# Patient Record
Sex: Male | Born: 1980 | Race: Black or African American | Hispanic: No | Marital: Single | Smoking: Current every day smoker
Health system: Southern US, Community
[De-identification: ages and names within clinical notes are randomized; demographics above are authoritative.]

## PROBLEM LIST (undated history)

## (undated) HISTORY — PX: APPENDECTOMY: SHX54

---

## 2004-07-30 ENCOUNTER — Emergency Department: Payer: Self-pay | Admitting: Emergency Medicine

## 2004-07-30 ENCOUNTER — Other Ambulatory Visit: Payer: Self-pay

## 2006-03-27 ENCOUNTER — Emergency Department: Payer: Self-pay
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
## 2010-09-12 ENCOUNTER — Emergency Department: Payer: Self-pay | Admitting: Emergency Medicine

## 2012-03-04 ENCOUNTER — Emergency Department: Payer: Self-pay | Admitting: Emergency Medicine

## 2012-03-04 LAB — URINALYSIS, COMPLETE
Bilirubin,UR: NEGATIVE
Glucose,UR: NEGATIVE mg/dL (ref 0–75)
Nitrite: NEGATIVE
Ph: 5 (ref 4.5–8.0)
RBC,UR: 1 /HPF (ref 0–5)
Specific Gravity: 1.028 (ref 1.003–1.030)

## 2012-10-14 DIAGNOSIS — E785 Hyperlipidemia, unspecified: Secondary | ICD-10-CM | POA: Insufficient documentation

## 2013-05-23 ENCOUNTER — Ambulatory Visit: Payer: Self-pay | Admitting: Family Medicine

## 2013-05-23 LAB — RAPID STREP-A WITH REFLX: Micro Text Report: POSITIVE

## 2013-05-26 DIAGNOSIS — I4891 Unspecified atrial fibrillation: Secondary | ICD-10-CM | POA: Insufficient documentation

## 2014-11-04 ENCOUNTER — Ambulatory Visit: Payer: Self-pay | Admitting: Physician Assistant

## 2016-04-04 DIAGNOSIS — G4733 Obstructive sleep apnea (adult) (pediatric): Secondary | ICD-10-CM | POA: Insufficient documentation

## 2016-10-25 ENCOUNTER — Encounter: Payer: Self-pay | Admitting: *Deleted

## 2016-10-25 ENCOUNTER — Ambulatory Visit
Admission: EM | Admit: 2016-10-25 | Discharge: 2016-10-25 | Discharge status: Against Medical Advice | Payer: Self-pay | Attending: Family Medicine | Admitting: Family Medicine

## 2016-10-25 DIAGNOSIS — Z024 Encounter for examination for driving license: Secondary | ICD-10-CM

## 2016-10-25 LAB — DEPT OF TRANSP DIPSTICK, URINE (ARMC ONLY)
Glucose, UA: NEGATIVE mg/dL
Hgb urine dipstick: NEGATIVE
PROTEIN: 30 mg/dL — AB
Specific Gravity, Urine: 1.02 (ref 1.005–1.030)

## 2016-10-25 NOTE — ED Provider Notes (Signed)
CSN: [id number]     Arrival date & time 10/25/16  1039 History   None    Chief Complaint  Patient presents with  . Commercial Driver's License Exam   (Consider location/radiation/quality/duration/timing/severity/associated  sxs/prior Treatment) HPI   This a 36 year old male presents for a DOT physical. Intake sheet showed that he had no medical problems but blood pressure elevation triggered a research for use of antihypertensive medications. Searching Care every where in the Epic EMR ,it was found that the patient was diagnosed with severe sleep apnea was on CPAP usage but the patient was not able to tolerate the machine. It was noted that he had severe daytime somnolence as well as fatigue. It was arranged for him to have nasal pillows use but the patient never did follow-up to have them. It was hypothesized that he may require surgical intervention. I discussed this with the patient and told him that his CPAP machine usage would have to show greater than 4 hours daily usage 75% of the time. Patient stated he was not using the CPAP machine and  no sense  completing a physical examination if he was just going to fail anyhow. He was very disgruntled and angry and left without being evaluated.        History reviewed. No pertinent past medical history. Past Surgical History:  Procedure Laterality Date  . APPENDECTOMY     History reviewed. No pertinent family history. Social History  Substance Use Topics  . Smoking status: Current Every Day Smoker    Packs/day: 1.00    Types: Cigarettes  . Smokeless tobacco: Never Used  . Alcohol use No    Review of Systems  Allergies  Patient has no known allergies.  Home Medications   Prior to Admission medications   Not on File   Meds Ordered and Administered this Visit  Medications - No data to display  BP (!) 144/89 (BP Location: Right Arm)   Pulse 63   Temp 98.4 F (36.9 C) (Oral)   Resp 15   Ht 6\' 1"  (1.854 m)   Wt 240 lb  (108.9 kg)   SpO2 100%   BMI 31.66 kg/m  No data found.   Physical Exam  Urgent Care Course     Procedures (including critical care time)  Labs Review Labs Reviewed  DEPT OF TRANSP DIPSTICK, URINE([facility] ONLY) - Abnormal; Notable for the following:       Result Value   Protein, ur 30 (*)    All other components within normal limits    Imaging Review No results found.   Visual Acuity Review  Right Eye Distance:   Left Eye Distance:   Bilateral Distance:    Right Eye Near:   Left Eye Near:    Bilateral Near:         MDM   1. Driver's permit PE (physical examination)    Patient failed due to obstructive sleep apnea and not using a CPAP machine as directed    Lutricia FeilWilliam P Mauria Asquith, PA-C 10/25/16 1523

## 2016-10-25 NOTE — ED Triage Notes (Signed)
Commercial Drivers License Physical exam.

## 2018-07-18 DIAGNOSIS — I10 Essential (primary) hypertension: Secondary | ICD-10-CM | POA: Insufficient documentation

## 2018-08-01 DIAGNOSIS — G4489 Other headache syndrome: Secondary | ICD-10-CM | POA: Insufficient documentation

## 2024-01-27 DIAGNOSIS — K429 Umbilical hernia without obstruction or gangrene: Secondary | ICD-10-CM | POA: Insufficient documentation

## 2024-01-27 DIAGNOSIS — R109 Unspecified abdominal pain: Secondary | ICD-10-CM | POA: Insufficient documentation

## 2024-04-28 DIAGNOSIS — J351 Hypertrophy of tonsils: Secondary | ICD-10-CM | POA: Insufficient documentation

## 2024-05-11 ENCOUNTER — Ambulatory Visit
Admission: EM | Admit: 2024-05-11 | Discharge: 2024-05-11 | Disposition: A | Discharge status: Home / Self Care | Attending: Physician Assistant | Admitting: Physician Assistant

## 2024-05-11 DIAGNOSIS — J039 Acute tonsillitis, unspecified: Secondary | ICD-10-CM | POA: Diagnosis present

## 2024-05-11 DIAGNOSIS — R509 Fever, unspecified: Secondary | ICD-10-CM | POA: Insufficient documentation

## 2024-05-11 DIAGNOSIS — R051 Acute cough: Secondary | ICD-10-CM | POA: Insufficient documentation

## 2024-05-11 LAB — GROUP A STREP BY PCR: Group A Strep by PCR: NOT DETECTED

## 2024-05-11 LAB — RESP PANEL BY RT-PCR (FLU A&B, COVID) ARPGX2
Influenza A by PCR: NEGATIVE
Influenza B by PCR: NEGATIVE
SARS Coronavirus 2 by RT PCR: NEGATIVE

## 2024-05-11 MED ORDER — AMOXICILLIN-POT CLAVULANATE 875-125 MG PO TABS: 1.0000 | ORAL_TABLET | Freq: Two times a day (BID) | ORAL | 0 refills | Status: AC

## 2024-05-11 MED ORDER — DEXAMETHASONE SODIUM PHOSPHATE 10 MG/ML IJ SOLN
10.0000 mg | Freq: Once | INTRAMUSCULAR | Status: AC
  Administered 2024-05-11: 10 mg via INTRAMUSCULAR

## 2024-05-11 NOTE — ED Provider Notes (Signed)
 [facility]    CSN: [id number] Arrival date & time: 05/11/24  1406      History   Chief Complaint Chief Complaint  Patient presents with   Fever   Generalized Body Aches         HPI Lee Wright is a 43 y.o. male presenting for fever up to 103 degrees, productive cough, sore throat and painful swallowing, nasal congestion and fatigue x 3 days. Negative COVID test this morning. Reports his tonsils are quite large and can feel they are swollen. Scheduled for tonsillectomy in 4 days. No sick contacts. Taking OTC meds.   HPI  History reviewed. No pertinent past medical history.  Patient Active Problem List   Diagnosis Date Noted   Enlarged tonsils 04/28/2024   Abdominal pain 01/27/2024   Umbilical hernia without obstruction and without gangrene 01/27/2024   Other headache syndrome 08/01/2018   Essential hypertension 07/18/2018   Obstructive sleep apnea 04/04/2016   Atrial fibrillation by electrocardiogram (HCC) 05/26/2013   Hyperlipidemia 10/14/2012    Past Surgical History:  Procedure Laterality Date   APPENDECTOMY         Home Medications    Prior to Admission medications   Medication Sig Start Date End Date Taking? Authorizing Provider  amoxicillin -clavulanate (AUGMENTIN ) 875-125 MG tablet Take 1 tablet by mouth every 12 (twelve) hours for 10 days. 05/11/24 05/21/24 Yes Arvis Jolan NOVAK, PA-C  clotrimazole-betamethasone (LOTRISONE) cream Apply to affected area 2 times daily 04/16/24 04/16/25 Yes [provider]  hydrochlorothiazide (HYDRODIURIL) 25 MG tablet Take 1 tablet (25 mg total) by mouth every morning. 12/10/23 12/09/24 Yes [provider]  losartan (COZAAR) 50 MG tablet Take 50 mg by mouth daily. 04/16/24 05/21/25 Yes [provider]  metoprolol succinate (TOPROL-XL) 50 MG 24 hr tablet Take 1 tablet (50 mg total) by mouth daily. May repeat dose for blood pressure > 140/90. 12/25/23 12/24/24 Yes [provider]   omeprazole (PRILOSEC) 20 MG capsule Take 1 capsule (20 mg total) by mouth two (2) times a day. 04/28/24 05/28/24 Yes [provider]  triamcinolone ointment (KENALOG) 0.1 % Apply topically two (2) times a day. 04/16/24 04/16/25 Yes [provider]  Varenicline Tartrate, Starter, 0.5 MG X 11 & 1 MG X 42 TBPK Take one 0.5mg  tab once daily for 3 days,then increase to one 0.5mg  tab twice daily for 4 days,then increase to one 1mg  tab twice daily. 02/13/24 05/13/24 Yes [provider]    Family History History reviewed. No pertinent family history.  Social History Social History   Tobacco Use   Smoking status: Every Day    Current packs/day: 1.00    Types: Cigarettes   Smokeless tobacco: Never  Substance Use Topics   Alcohol use: No   Drug use: No     Allergies   Patient has no known allergies.   Review of Systems Review of Systems  Constitutional:  Positive for fatigue and fever.  HENT:  Positive for congestion, rhinorrhea, sore throat and trouble swallowing. Negative for sinus pain.   Respiratory:  Positive for cough. Negative for shortness of breath.   Cardiovascular:  Negative for chest pain.  Gastrointestinal:  Negative for abdominal pain, diarrhea, nausea and vomiting.  Musculoskeletal:  Negative for myalgias.  Neurological:  Negative for weakness, light-headedness and headaches.  Hematological:  Negative for adenopathy.     Physical Exam Triage Vital Signs ED Triage Vitals  Encounter Vitals Group     BP  Girls Systolic BP Percentile      Girls Diastolic BP Percentile      Boys Systolic BP Percentile      Boys Diastolic BP Percentile      Pulse      Resp      Temp      Temp src      SpO2      Weight      Height      Head Circumference      Peak Flow      Pain Score      Pain Loc      Pain Education      Exclude from Growth Chart    No data found.  Updated Vital Signs BP (!) 135/93 (BP Location: Right Arm)   Pulse 83   Temp  99.6 F (37.6 C) (Oral)   Resp 16   Ht 6' 2 (1.88 m)   Wt 280 lb (127 kg)   SpO2 95%   BMI 35.95 kg/m      Physical Exam Vitals and nursing note reviewed.  Constitutional:      General: He is not in acute distress.    Appearance: Normal appearance. He is well-developed. He is ill-appearing.  HENT:     Head: Normocephalic and atraumatic.     Nose: Congestion present.     Mouth/Throat:     Mouth: Mucous membranes are moist.     Pharynx: Posterior oropharyngeal erythema present.     Tonsils: 3+ on the right. 3+ on the left.     Comments: +muffled voice Eyes:     General: No scleral icterus.    Conjunctiva/sclera: Conjunctivae normal.  Cardiovascular:     Rate and Rhythm: Normal rate and regular rhythm.  Pulmonary:     Effort: Pulmonary effort is normal. No respiratory distress.     Breath sounds: Normal breath sounds.  Abdominal:     Palpations: Abdomen is soft.  Musculoskeletal:     Cervical back: Neck supple.  Lymphadenopathy:     Cervical: Cervical adenopathy present.  Skin:    General: Skin is warm and dry.     Capillary Refill: Capillary refill takes less than 2 seconds.  Neurological:     General: No focal deficit present.     Mental Status: He is alert. Mental status is at baseline.     Motor: No weakness.     Gait: Gait normal.  Psychiatric:        Mood and Affect: Mood normal.      UC Treatments / Results  Labs (all labs ordered are listed, but only abnormal results are displayed) Labs Reviewed  RESP PANEL BY RT-PCR (FLU A&B, COVID) ARPGX2  GROUP A STREP BY PCR    EKG   Radiology No results found.  Procedures Procedures (including critical care time)  Medications Ordered in UC Medications  dexamethasone  (DECADRON ) injection 10 mg (10 mg Intramuscular Given 05/11/24 1607)    Initial Impression / Assessment and Plan / UC Course  I have reviewed the triage vital signs and the nursing notes.  Pertinent labs & imaging results that were  available during my care of the patient were reviewed by me and considered in my medical decision making (see chart for details).   43 year old male presents for sore throat, swollen tonsils, fever, fatigue, cough, congestion x 3 days.  Temp to 103 degrees.  Patient is scheduled for tonsillectomy on 05/15/2024.  Vitals are stable.  He is in no  acute distress.  He is spitting out a lot of mucus because he says it hurts to swallow so much.  His tonsils are 3+ bilateral and there is significant erythema and muffled voice.  Nasal congestion present.  Chest is clear.  Heart regular rate and rhythm.  PCR strep testing respiratory panel obtained.  All negative testing.  Acute tonsillitis.  Given significant tonsil swelling and fever we will treat at this time with antibiotics.  I sent Augmentin  to pharmacy.  Patient was also given 10 mg IM dexamethasone  in clinic for tonsil swelling.  Strict ER precautions discussed with patient.  Advised to go to ER if not breaking fever in a couple days, increased swelling, unable to tolerate liquids or swallow saliva.  Advised him to inform his surgical team of his symptoms and fever.  Acute illness with systemic symptoms.   Final Clinical Impressions(s) / UC Diagnoses   Final diagnoses:  Acute tonsillitis, unspecified etiology  Fever, unspecified  Acute cough     Discharge Instructions      -You were given an injection of dexamethasone  in clinic for tonsil swelling -Antibiotics sent to pharmacy -Please inform your surgical team of your tonsil swelling, pain and fever.  -Go to ER if fever not breaking in 2 days or you cannot swallow fluids or saliva.     ED Prescriptions     Medication Sig Dispense Auth. Provider   amoxicillin -clavulanate (AUGMENTIN ) 875-125 MG tablet Take 1 tablet by mouth every 12 (twelve) hours for 10 days. 20 tablet Ceili Boshers B, PA-C      PDMP not reviewed this encounter.   Arvis Jolan NOVAK, PA-C 05/11/24 [phone]

## 2024-05-11 NOTE — ED Triage Notes (Signed)
 Pt c/o fever,bodyaches,sore throat & congestion x3 days. Tmax 103 last night. Has tried tylenol w/o relief. Last dose around 1100.

## 2024-05-11 NOTE — Discharge Instructions (Signed)
-  You were given an injection of dexamethasone  in clinic for tonsil swelling -Antibiotics sent to pharmacy -Please inform your surgical team of your tonsil swelling, pain and fever.  -Go to ER if fever not breaking in 2 days or you cannot swallow fluids or saliva.

## 2024-08-10 ENCOUNTER — Ambulatory Visit
Admission: EM | Admit: 2024-08-10 | Discharge: 2024-08-10 | Disposition: A | Discharge status: Home / Self Care | Attending: Emergency Medicine | Admitting: Emergency Medicine

## 2024-08-10 DIAGNOSIS — Z202 Contact with and (suspected) exposure to infections with a predominantly sexual mode of transmission: Secondary | ICD-10-CM | POA: Insufficient documentation

## 2024-08-10 NOTE — ED Provider Notes (Addendum)
 [facility]    CSN: [id number] Arrival date & time: 08/10/24  1521      History   Chief Complaint Chief Complaint  Patient presents with   Exposure to STD    HPI Lee Wright is a 43 y.o. male.   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  He reports that he has a single sexual partner and typically they utilize a condom but the last time they had intercourse, 2 weeks ago, the condom broke.  He was contacted 3 days ago by the health department and advised to be checked.  He denies any chancre, discharge, or dysuria.  History reviewed. No pertinent past medical history.  Patient Active Problem List   Diagnosis Date Noted   Enlarged tonsils 04/28/2024   Abdominal pain 01/27/2024   Umbilical hernia without obstruction and without gangrene 01/27/2024   Other headache syndrome 08/01/2018   Essential hypertension 07/18/2018   Obstructive sleep apnea 04/04/2016   Atrial fibrillation by electrocardiogram (HCC) 05/26/2013   Hyperlipidemia 10/14/2012    Past Surgical History:  Procedure Laterality Date   APPENDECTOMY         Home Medications    Prior to Admission medications   Medication Sig Start Date End Date Taking? Authorizing Provider  hydrochlorothiazide (HYDRODIURIL) 25 MG tablet Take 1 tablet (25 mg total) by mouth every morning. 12/10/23 12/09/24 Yes [provider]  losartan (COZAAR) 50 MG tablet Take 50 mg by mouth daily. 04/16/24 05/21/25 Yes [provider]  clotrimazole-betamethasone (LOTRISONE) cream Apply to affected area 2 times daily 04/16/24 04/16/25  [provider]  metoprolol succinate (TOPROL-XL) 50 MG 24 hr tablet Take 1 tablet (50 mg total) by mouth daily. May repeat dose for blood pressure > 140/90. 12/25/23  12/24/24  [provider]  omeprazole (PRILOSEC) 20 MG capsule Take 1 capsule (20 mg total) by mouth two (2) times a day. 04/28/24 05/28/24  [provider]  triamcinolone ointment (KENALOG) 0.1 % Apply topically two (2) times a day. 04/16/24 04/16/25  [provider]    Family History History reviewed. No pertinent family history.  Social History Social History   Tobacco Use   Smoking status: Every Day    Current packs/day: 1.00    Types: Cigarettes   Smokeless tobacco: Never  Vaping Use   Vaping status: Never Used  Substance Use Topics   Alcohol use: No   Drug use: No     Allergies   Patient has no known allergies.   Review of Systems Review of Systems  Genitourinary:  Negative for dysuria, frequency, genital sores, hematuria, penile discharge, penile pain, penile swelling, scrotal swelling, testicular pain and urgency.     Physical Exam Triage Vital Signs ED Triage Vitals  Encounter Vitals Group     BP      Girls Systolic BP Percentile      Girls Diastolic BP Percentile      Boys Systolic BP Percentile      Boys Diastolic BP Percentile      Pulse      Resp      Temp      Temp src      SpO2      Weight      Height      Head Circumference  Peak Flow      Pain Score      Pain Loc      Pain Education      Exclude from Growth Chart    No data found.  Updated Vital Signs BP (!) 174/117 (BP Location: Right Arm)   Pulse 93   Temp 98.9 F (37.2 C) (Oral)   Resp 18   Wt 280 lb (127 kg)   SpO2 97%   BMI 35.95 kg/m   Visual Acuity Right Eye Distance:   Left Eye Distance:   Bilateral Distance:    Right Eye Near:   Left Eye Near:    Bilateral Near:     Physical Exam Vitals and nursing note reviewed.  Constitutional:      Appearance: Normal appearance. He is not ill-appearing.  HENT:     Head: Normocephalic and atraumatic.  Genitourinary:    Penis: Normal.      Testes: Normal.  Musculoskeletal:        General:  Normal range of motion.  Skin:    General: Skin is warm and dry.  Neurological:     Mental Status: He is alert.      UC Treatments / Results  Labs (all labs ordered are listed, but only abnormal results are displayed) Labs Reviewed  SYPHILIS: RPR W/REFLEX TO RPR TITER AND TREPONEMAL ANTIBODIES, TRADITIONAL SCREENING AND DIAGNOSIS ALGORITHM  CYTOLOGY, (ORAL, ANAL, URETHRAL) ANCILLARY ONLY    EKG   Radiology No results found.  Procedures Procedures (including critical care time)  Medications Ordered in UC Medications - No data to display  Initial Impression / Assessment and Plan / UC Course  I have reviewed the triage vital signs and the nursing notes.  Pertinent labs & imaging results that were available during my care of the patient were reviewed by me and considered in my medical decision making (see chart for details).   Patient is a pleasant, nontoxic-appearing 43 year old male presenting for evaluation after being informed that he could have been exposed to syphilis.  He is not experiencing any symptoms other than tiredness present.  Genital exam does not reveal the presence of chancre, urethral discharge, rashes or lesions on the glans penis or the shaft of the penis.  No testicular pain or tenderness.  No scrotal lesions or masses.  I asked the patient if he went to be tested for more than syphilis and he states that he recently had an HIV test due to having surgery so he is not interested in HIV but he is consenting to gonorrhea, chlamydia, and trichomonas testing.  I did collect a swab at bedside.  RPR is positive.  Titer is pending as well as confirmatory T palladium antibody testing.  RPR titer 1:1.  Cytology swab was negative for gonorrhea, chlamydia, and trichomonas.  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  Patient will need to be on doxycycline 100 mg twice daily for 28 days.   Final Clinical Impressions(s) / UC Diagnoses   Final diagnoses:  Exposure to STD      Discharge Instructions      As we discussed, your testing will be back in next 1 to 2 days.  If you test positive for any infection you will be contacted by phone and treatment options will be provided.  If your results are negative they will appear in your MyChart.  Abstain from sexual intercourse, or at least unprotected sexual intercourse, until after your test results are back and after you your partner has completed  antibiotic therapy for their infection.     ED Prescriptions   None    PDMP not reviewed this encounter.   Bernardino Ditch, NP 08/10/24 1559    Bernardino Ditch, NP 08/11/24 1002    Bernardino Ditch, NP 08/11/24 1155    Bernardino Ditch, NP 08/11/24 1755    Bernardino Ditch, NP 08/14/24 [phone]

## 2024-08-10 NOTE — Discharge Instructions (Addendum)
 As we discussed, your testing will be back in next 1 to 2 days.  If you test positive for any infection you will be contacted by phone and treatment options will be provided.  If your results are negative they will appear in your MyChart.  Abstain from sexual intercourse, or at least unprotected sexual intercourse, until after your test results are back and after you your partner has completed antibiotic therapy for their infection.

## 2024-08-10 NOTE — ED Triage Notes (Signed)
 Patient states that he got a call from the health department Friday letting him know that he had been exposed to syphilis. Health department tried giving an appointment that was 2 weeks out. Patient states that feels tired.

## 2024-08-11 ENCOUNTER — Ambulatory Visit (HOSPITAL_COMMUNITY): Payer: Self-pay

## 2024-08-11 ENCOUNTER — Ambulatory Visit: Payer: Self-pay

## 2024-08-11 LAB — CYTOLOGY, (ORAL, ANAL, URETHRAL) ANCILLARY ONLY
Chlamydia: NEGATIVE
Comment: NEGATIVE
Comment: NEGATIVE
Comment: NORMAL
Neisseria Gonorrhea: NEGATIVE
Trichomonas: NEGATIVE

## 2024-08-11 LAB — SYPHILIS: RPR W/REFLEX TO RPR TITER AND TREPONEMAL ANTIBODIES, TRADITIONAL SCREENING AND DIAGNOSIS ALGORITHM
RPR Ser Ql: REACTIVE — AB
RPR Titer: 1:1 {titer}

## 2024-08-12 LAB — T.PALLIDUM AB, TOTAL: T Pallidum Abs: REACTIVE — AB

## 2024-08-19 MED ORDER — DOXYCYCLINE HYCLATE 100 MG PO TABS: 100.0000 mg | ORAL_TABLET | Freq: Two times a day (BID) | ORAL | 0 refills | Status: AC

## 2024-08-26 ENCOUNTER — Encounter (HOSPITAL_COMMUNITY): Payer: Self-pay
